# Patient Record
Sex: Female | Born: 1965
Health system: Southern US, Community
[De-identification: ages and names within clinical notes are randomized; demographics above are authoritative.]

## PROBLEM LIST (undated history)

## (undated) HISTORY — PX: EYE SURGERY: SHX253

---

## 2007-10-26 ENCOUNTER — Encounter (INDEPENDENT_AMBULATORY_CARE_PROVIDER_SITE_OTHER): Payer: Self-pay | Admitting: Nurse Practitioner

## 2007-10-26 ENCOUNTER — Ambulatory Visit: Payer: Self-pay | Admitting: Internal Medicine

## 2007-10-26 LAB — CONVERTED CEMR LAB
ALT: 13 units/L (ref 0–35)
Albumin: 4.4 g/dL (ref 3.5–5.2)
Basophils Relative: 0 % (ref 0–1)
Calcium: 9.3 mg/dL (ref 8.4–10.5)
Chloride: 105 meq/L (ref 96–112)
Creatinine, Ser: 0.75 mg/dL (ref 0.40–1.20)
Eosinophils Absolute: 0.1 10*3/uL (ref 0.0–0.7)
Eosinophils Relative: 2 % (ref 0–5)
Glucose, Bld: 110 mg/dL — ABNORMAL HIGH (ref 70–99)
Lymphocytes Relative: 44 % (ref 12–46)
MCHC: 33.1 g/dL (ref 30.0–36.0)
MCV: 87.2 fL (ref 78.0–100.0)
Monocytes Relative: 10 % (ref 3–12)
Platelets: 280 10*3/uL (ref 150–400)
Potassium: 4.4 meq/L (ref 3.5–5.3)
RDW: 13.1 % (ref 11.5–15.5)
Sodium: 140 meq/L (ref 135–145)
TSH: 1.346 microintl units/mL (ref 0.350–5.50)
Total Protein: 7.7 g/dL (ref 6.0–8.3)
WBC: 3.6 10*3/uL — ABNORMAL LOW (ref 4.0–10.5)

## 2007-10-27 ENCOUNTER — Ambulatory Visit: Payer: Self-pay | Admitting: *Deleted

## 2007-11-03 ENCOUNTER — Encounter (INDEPENDENT_AMBULATORY_CARE_PROVIDER_SITE_OTHER): Payer: Self-pay | Admitting: Family Medicine

## 2007-11-03 ENCOUNTER — Ambulatory Visit (HOSPITAL_COMMUNITY): Admission: RE | Admit: 2007-11-03 | Discharge: 2007-11-03 | Payer: Self-pay | Admitting: Family Medicine

## 2007-11-03 ENCOUNTER — Ambulatory Visit: Payer: Self-pay | Admitting: Surgery

## 2008-05-24 ENCOUNTER — Emergency Department (HOSPITAL_COMMUNITY): Admission: EM | Admit: 2008-05-24 | Discharge: 2008-05-24 | Payer: Self-pay | Admitting: Emergency Medicine

## 2009-03-27 ENCOUNTER — Ambulatory Visit: Payer: Self-pay | Admitting: Internal Medicine

## 2009-03-27 ENCOUNTER — Encounter (INDEPENDENT_AMBULATORY_CARE_PROVIDER_SITE_OTHER): Payer: Self-pay | Admitting: Internal Medicine

## 2009-03-27 LAB — CONVERTED CEMR LAB
ALT: 11 units/L (ref 0–35)
AST: 14 units/L (ref 0–37)
Albumin: 4.4 g/dL (ref 3.5–5.2)
Alkaline Phosphatase: 60 units/L (ref 39–117)
Calcium: 9.5 mg/dL (ref 8.4–10.5)
Cholesterol: 178 mg/dL (ref 0–200)
Creatinine, Ser: 0.48 mg/dL (ref 0.40–1.20)
HDL: 45 mg/dL (ref >39)
Sodium: 141 meq/L (ref 135–145)
Total Protein: 7.4 g/dL (ref 6.0–8.3)
VLDL: 22 mg/dL (ref 0–40)

## 2009-04-24 ENCOUNTER — Ambulatory Visit: Payer: Self-pay | Admitting: Internal Medicine

## 2009-04-24 ENCOUNTER — Encounter (INDEPENDENT_AMBULATORY_CARE_PROVIDER_SITE_OTHER): Payer: Self-pay | Admitting: Internal Medicine

## 2009-06-23 ENCOUNTER — Ambulatory Visit: Payer: Self-pay | Admitting: Internal Medicine

## 2009-06-23 ENCOUNTER — Encounter (INDEPENDENT_AMBULATORY_CARE_PROVIDER_SITE_OTHER): Payer: Self-pay | Admitting: Adult Health

## 2009-06-23 LAB — CONVERTED CEMR LAB
Chlamydia, DNA Probe: NEGATIVE
GC Probe Amp, Genital: NEGATIVE
Microalb, Ur: 0.5 mg/dL (ref 0.00–1.89)

## 2009-07-04 ENCOUNTER — Ambulatory Visit (HOSPITAL_COMMUNITY): Admission: RE | Admit: 2009-07-04 | Discharge: 2009-07-04 | Payer: Self-pay | Admitting: Family Medicine

## 2009-09-07 ENCOUNTER — Encounter (INDEPENDENT_AMBULATORY_CARE_PROVIDER_SITE_OTHER): Payer: Self-pay | Admitting: Adult Health

## 2009-09-07 ENCOUNTER — Ambulatory Visit: Payer: Self-pay | Admitting: Internal Medicine

## 2009-09-07 LAB — CONVERTED CEMR LAB
ALT: 11 units/L (ref 0–35)
Albumin: 4.4 g/dL (ref 3.5–5.2)
Calcium: 9.5 mg/dL (ref 8.4–10.5)
LDL Cholesterol: 109 mg/dL — ABNORMAL HIGH (ref 0–99)
Potassium: 4.4 meq/L (ref 3.5–5.3)
Sodium: 136 meq/L (ref 135–145)
Total Bilirubin: 0.5 mg/dL (ref 0.3–1.2)
Triglycerides: 92 mg/dL (ref <150)

## 2009-09-15 ENCOUNTER — Ambulatory Visit: Payer: Self-pay | Admitting: Internal Medicine

## 2011-12-11 ENCOUNTER — Other Ambulatory Visit (HOSPITAL_COMMUNITY): Payer: Self-pay | Admitting: Family Medicine

## 2011-12-11 DIAGNOSIS — Z1231 Encounter for screening mammogram for malignant neoplasm of breast: Secondary | ICD-10-CM

## 2012-01-03 ENCOUNTER — Ambulatory Visit (HOSPITAL_COMMUNITY)
Admission: RE | Admit: 2012-01-03 | Discharge: 2012-01-03 | Disposition: A | Discharge status: Home / Self Care | Payer: 59 | Source: Ambulatory Visit | Attending: Family Medicine | Admitting: Family Medicine

## 2012-01-03 DIAGNOSIS — Z1231 Encounter for screening mammogram for malignant neoplasm of breast: Secondary | ICD-10-CM

## 2012-06-29 ENCOUNTER — Other Ambulatory Visit: Payer: Self-pay | Admitting: Family Medicine

## 2012-06-29 DIAGNOSIS — M799 Soft tissue disorder, unspecified: Secondary | ICD-10-CM

## 2012-07-03 ENCOUNTER — Ambulatory Visit
Admission: RE | Admit: 2012-07-03 | Discharge: 2012-07-03 | Disposition: A | Discharge status: Home / Self Care | Payer: 59 | Source: Ambulatory Visit | Attending: Family Medicine | Admitting: Family Medicine

## 2012-07-03 DIAGNOSIS — M799 Soft tissue disorder, unspecified: Secondary | ICD-10-CM

## 2012-07-03 IMAGING — US US CHEST/MEDIASTINUM
1 series · 14 of 16 positions shown · non-contrast
Comparison: None.

CLINICAL DATA: The soft tissue nodules over the upper chest.

CHEST ULTRASOUND

[Series 1: us chest/mediastinum · 0.03mm/px · 14 of 19 slices shown]
[im 1/19]
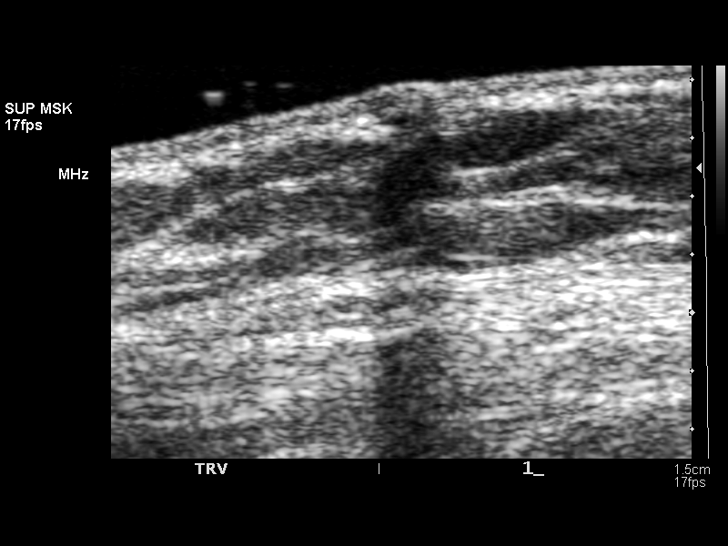
[im 2/19]
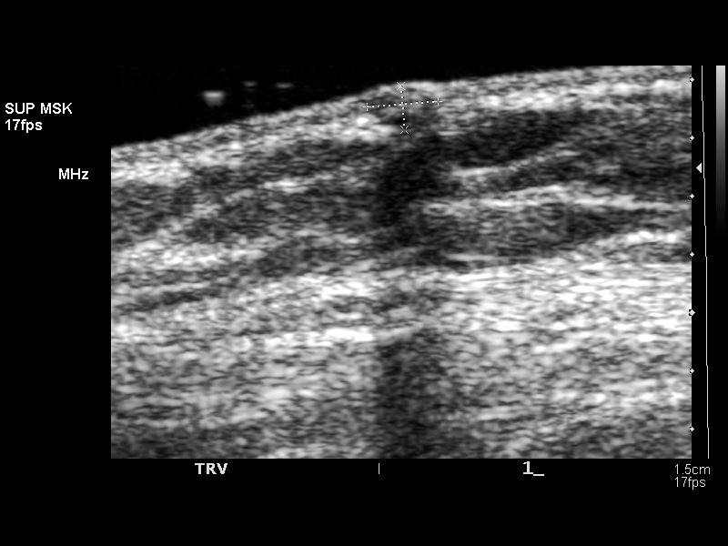
[im 3/19]
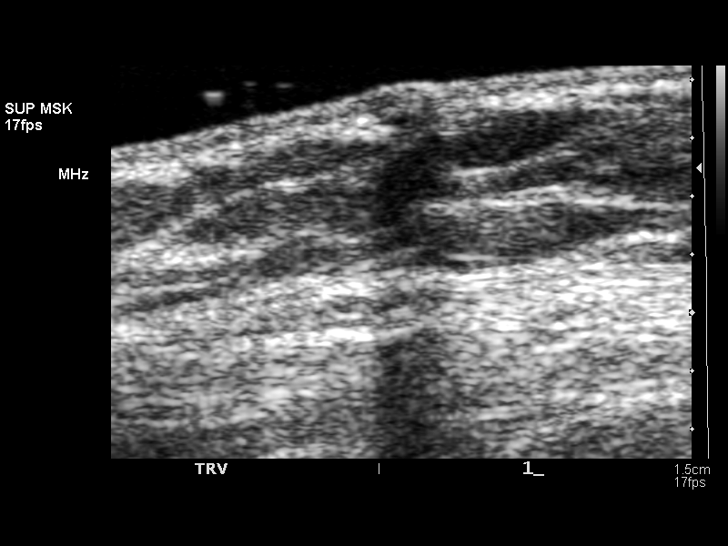
[im 5/19]
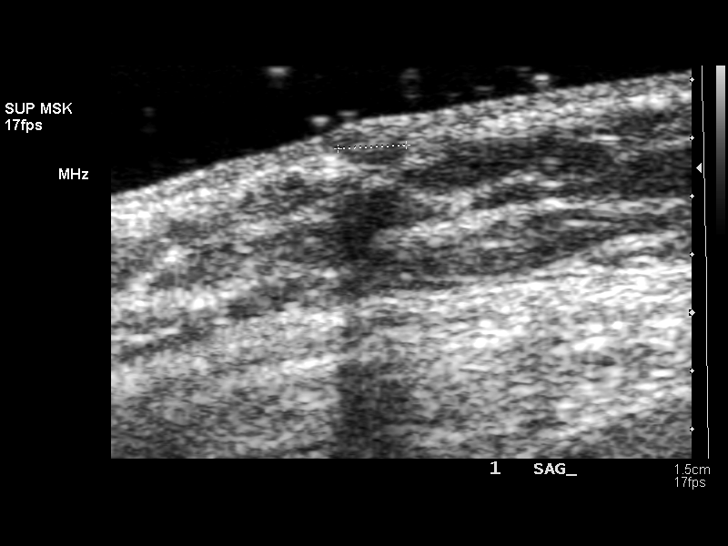
[im 7/19]
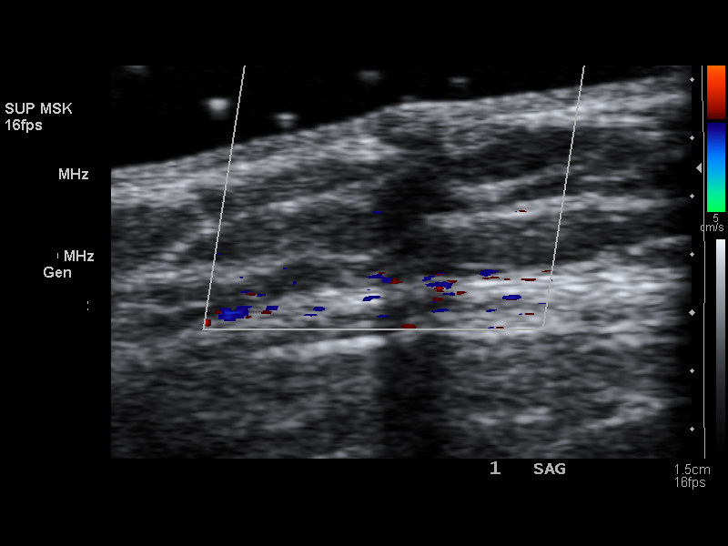
[im 8/19]
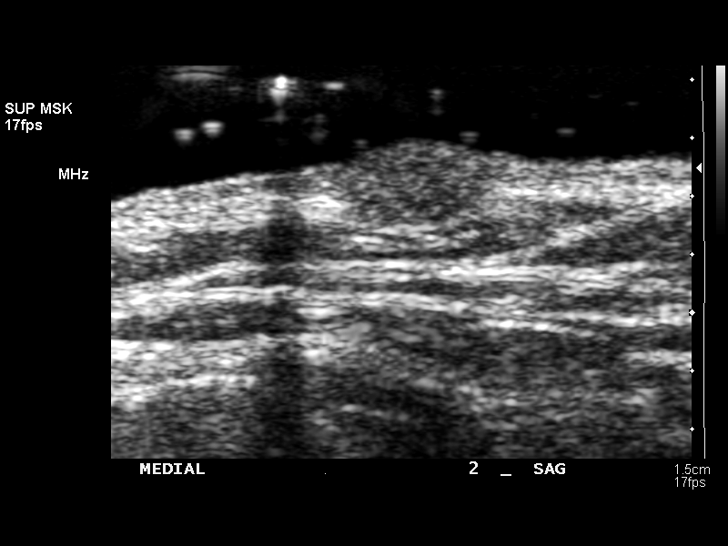
[im 9/19]
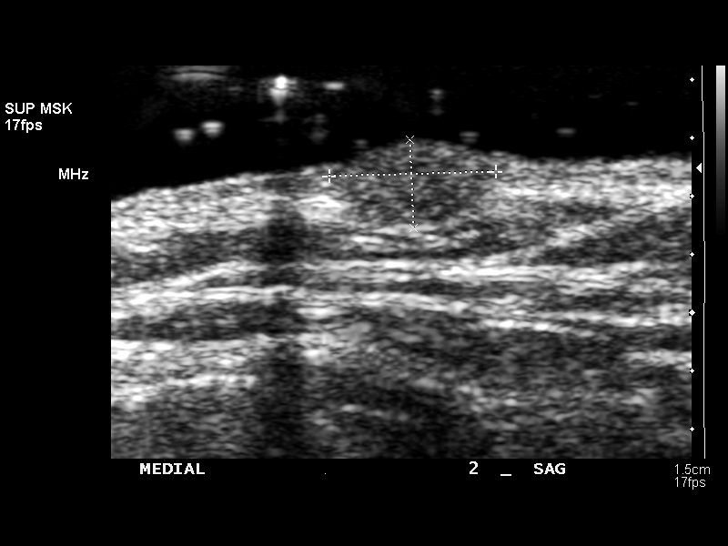
[im 10/19]
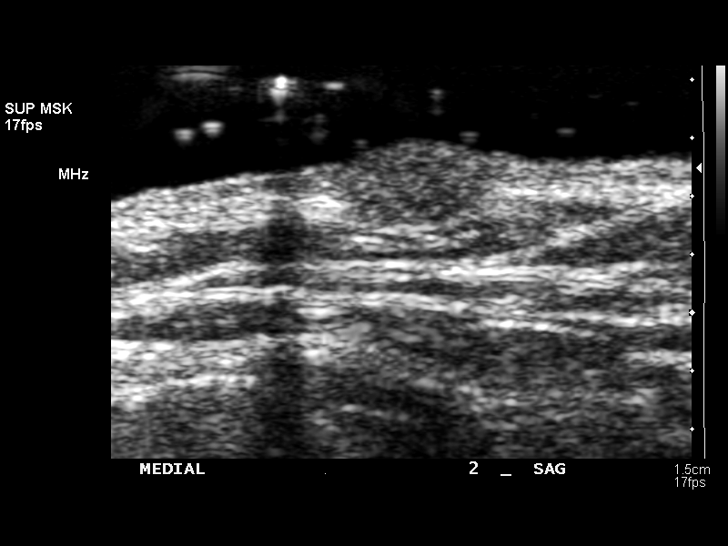
[im 11/19]
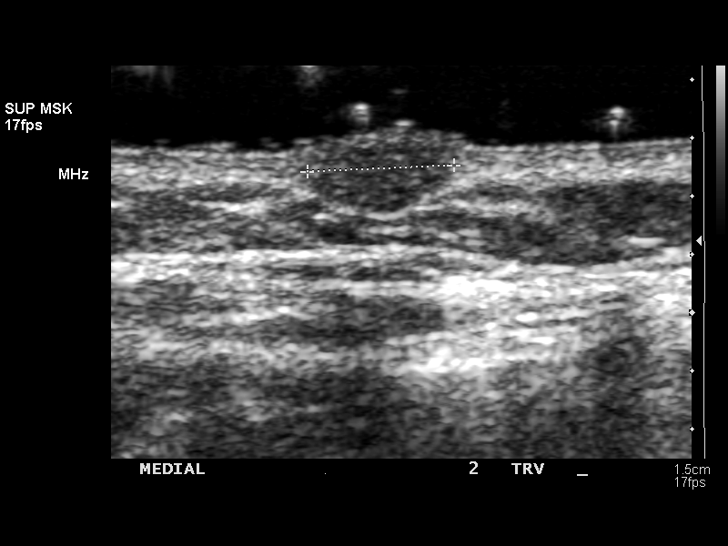
[im 13/19]
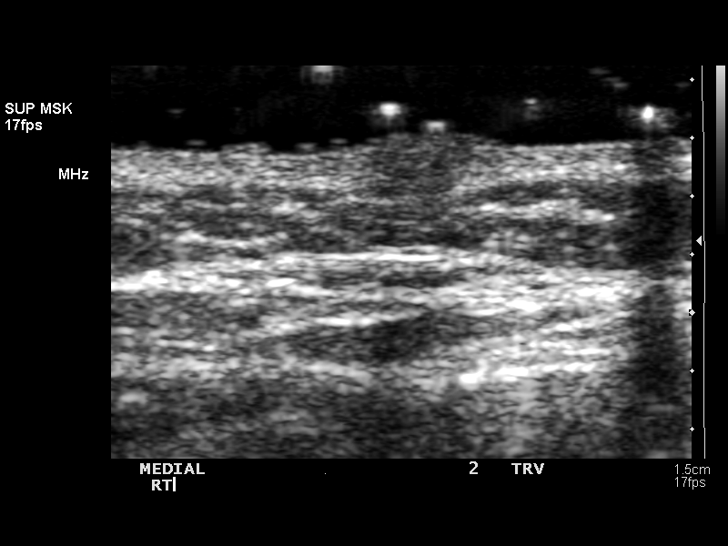
[im 15/19]
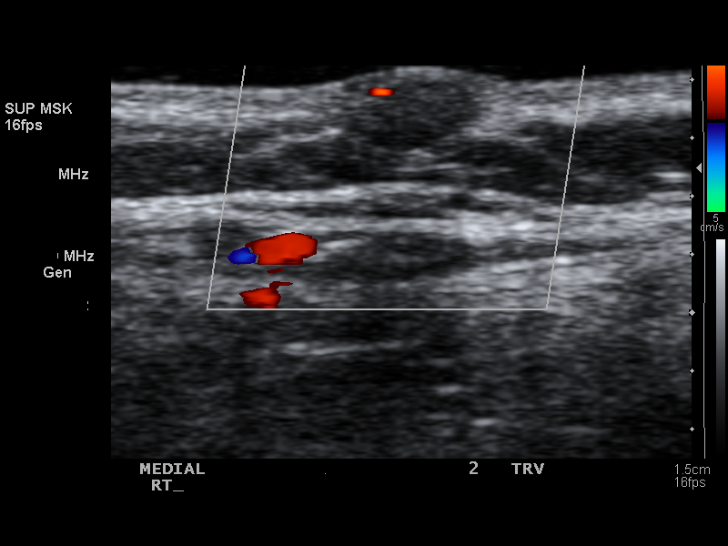
[im 16/19]
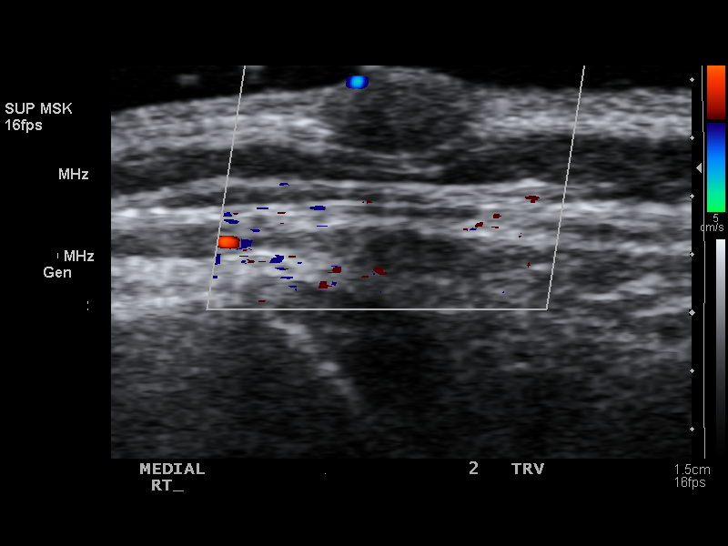
[im 17/19]
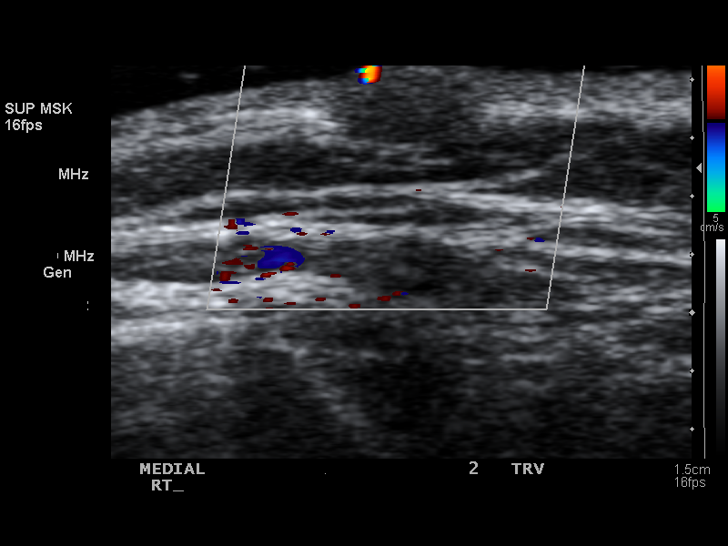
[im 19/19]
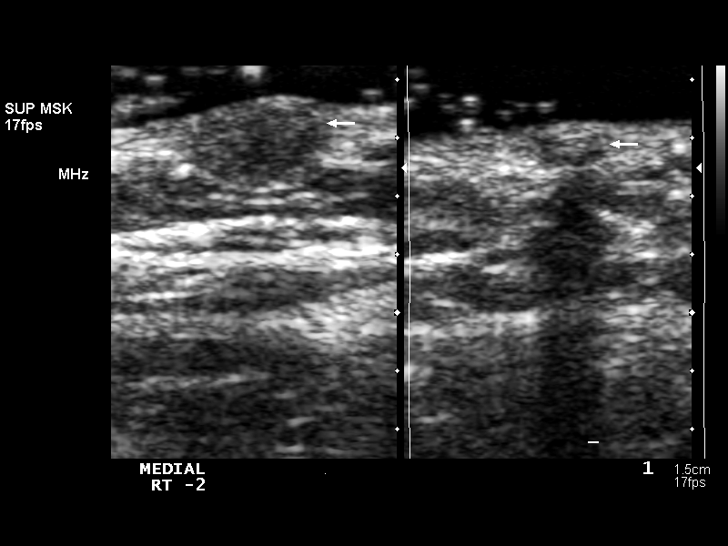

[14 of 16 positions shown; findings below may reference images not displayed]

FINDINGS: Ultrasound over the area in question was performed.
There are two immediately subcutaneous soft tissue nodules present.
One lies over the sternal notch measuring 0.2 x 0.2 x 0.2 cm.  No
definite blood flow is seen.  The second nodule lies over the right
clavicle medially measuring 0.6 x 0.3 x 0.5 cm with some blood flow
peripherally.  These most likely represent subcutaneous sebaceous
cysts.
IMPRESSION: Two small soft tissue nodules immediately subcutaneous in location
as noted above most consistent with sebaceous cysts.  Recommend
clinical follow-up.

## 2013-01-19 ENCOUNTER — Other Ambulatory Visit (HOSPITAL_COMMUNITY): Payer: Self-pay | Admitting: Family Medicine

## 2013-01-19 DIAGNOSIS — Z1231 Encounter for screening mammogram for malignant neoplasm of breast: Secondary | ICD-10-CM

## 2013-01-22 ENCOUNTER — Ambulatory Visit (HOSPITAL_COMMUNITY)
Admission: RE | Admit: 2013-01-22 | Discharge: 2013-01-22 | Disposition: A | Discharge status: Home / Self Care | Payer: 59 | Source: Ambulatory Visit | Attending: Family Medicine | Admitting: Family Medicine

## 2013-01-22 DIAGNOSIS — Z1231 Encounter for screening mammogram for malignant neoplasm of breast: Secondary | ICD-10-CM | POA: Insufficient documentation

## 2013-12-27 ENCOUNTER — Other Ambulatory Visit (HOSPITAL_COMMUNITY): Payer: Self-pay | Admitting: Family Medicine

## 2013-12-27 DIAGNOSIS — Z1231 Encounter for screening mammogram for malignant neoplasm of breast: Secondary | ICD-10-CM

## 2014-01-26 ENCOUNTER — Ambulatory Visit (HOSPITAL_COMMUNITY)
Admission: RE | Admit: 2014-01-26 | Discharge: 2014-01-26 | Disposition: A | Discharge status: Home / Self Care | Payer: 59 | Source: Ambulatory Visit | Attending: Family Medicine | Admitting: Family Medicine

## 2014-01-26 DIAGNOSIS — Z1231 Encounter for screening mammogram for malignant neoplasm of breast: Secondary | ICD-10-CM | POA: Diagnosis present

## 2015-08-23 ENCOUNTER — Ambulatory Visit (INDEPENDENT_AMBULATORY_CARE_PROVIDER_SITE_OTHER): Payer: 59 | Admitting: Physician Assistant

## 2015-08-23 VITALS — BP 120/70 | HR 90 | Temp 98.9°F | Resp 16 | Ht 60.0 in | Wt 192.0 lb

## 2015-08-23 DIAGNOSIS — R05 Cough: Secondary | ICD-10-CM

## 2015-08-23 DIAGNOSIS — R0981 Nasal congestion: Secondary | ICD-10-CM | POA: Diagnosis not present

## 2015-08-23 DIAGNOSIS — R059 Cough, unspecified: Secondary | ICD-10-CM

## 2015-08-23 MED ORDER — BENZONATATE 100 MG PO CAPS: 100.0000 mg | ORAL_CAPSULE | Freq: Three times a day (TID) | ORAL | Status: AC | PRN

## 2015-08-23 MED ORDER — IPRATROPIUM BROMIDE 0.03 % NA SOLN: 2.0000 | Freq: Two times a day (BID) | NASAL | Status: AC

## 2015-08-23 NOTE — Progress Notes (Signed)
   Subjective:    Patient ID: Anne Fisher, female    DOB: 07/20/1965, 50 y.o.   MRN: [id number]  Chief Complaint  Patient presents with  . Cough  . Nasal Congestion    HPI  [name] is a 50 yo female with no significant pmh here today for x 1 week history of cough, and nasal congestion.  She states that her symptoms started approx 1 week ago. She endorses productive cough with white mucus, clear rhinorrhea, myalgia, sore throat, sinus pressure. She also complains of intermittent diarrhea. Denies fever, chills, headache, ear pain, N/V, abd pain.  Her husband was treated today for the flu and given Tamiflu. His symptoms started 2 days ago.   Review of Systems  Constitutional: Positive for chills and fatigue. Negative for fever.  HENT: Positive for congestion, rhinorrhea, sinus pressure and sore throat. Negative for ear pain.   Eyes: Negative.   Respiratory: Positive for cough. Negative for shortness of breath.   Cardiovascular: Negative for chest pain.  Gastrointestinal: Positive for diarrhea. Negative for nausea, vomiting and abdominal pain.  Genitourinary: Negative for dysuria, urgency, frequency and flank pain.  Musculoskeletal: Positive for myalgias.  Neurological: Negative for dizziness, syncope, light-headedness and headaches.       Objective:   Physical Exam  Constitutional: She is oriented to person, place, and time. She appears well-developed and well-nourished. No distress.  HENT:  Head: Normocephalic and atraumatic.  Right Ear: External ear normal.  Left Ear: External ear normal.  Nose: Nose normal.  Mouth/Throat: Oropharynx is clear and moist. No oropharyngeal exudate.  Eyes: Conjunctivae are normal. Pupils are equal, round, and reactive to light.  Neck: Normal range of motion. Neck supple. No thyromegaly present.  Cardiovascular: Normal rate, regular rhythm and normal heart sounds.   Pulmonary/Chest: Effort normal and breath sounds normal.  Abdominal: Soft.  Bowel sounds are normal. There is no tenderness.  Lymphadenopathy:    She has no cervical adenopathy.  Neurological: She is alert and oriented to person, place, and time.  Skin: Skin is warm and dry.  Psychiatric: She has a normal mood and affect. Her behavior is normal. Judgment and thought content normal.          Assessment & Plan:  1. Cough 2. Sinus congestion Symptoms have been present for 1 week. Most likely viral URI. Influenza swab would not change treatment. Tamiflu is not recommended because symptoms have been present longer than 48 hours. Educated patient on getting plenty of rest and drinking fluids. - benzonatate (TESSALON) 100 MG capsule; Take 1-2 capsules (100-200 mg total) by mouth 3 (three) times daily as needed for cough.  Dispense: 40 capsule; Refill: 0 - ipratropium (ATROVENT) 0.03 % nasal spray; Place 2 sprays into both nostrils 2 (two) times daily.  Dispense: 30 mL; Refill: 0

## 2015-08-23 NOTE — Patient Instructions (Addendum)
Please get plenty of rest and stay hydrated. May take ibuprofen and tylenol for fever and pain.    IF you received an x-ray today, you will [facility]. Please contact [facility] at [phone] with questions or concerns regarding your invoice.   IF you received labwork today, you will receive an invoice from [facility]. Please contact [facility] at [phone] with questions or concerns regarding your invoice.   Our billing staff will not be able to assist you with questions regarding bills from these companies.  You will be contacted with the lab results as soon as they are available. The fastest way to get your results is to activate your My Chart account. Instructions are located on the last page of this paperwork. If you have not heard from us regarding the results in 2 weeks, please contact this office.

## 2015-08-26 NOTE — Progress Notes (Signed)
Patient ID: Anne Fisher, female    DOB: 1966-01-31, 50 y.o.   MRN: [id number]  PCP: No primary care provider on file.  Chief Complaint  Patient presents with  . Cough  . Nasal Congestion    Subjective:  HPI Presents today for evaluation of 1 week history of cough, and nasal congestion. She is accompanied by her husband and her son.  She states that her symptoms started approx 1 week ago. She endorses productive cough with white mucus, clear rhinorrhea, myalgia, sore throat, sinus pressure. She also complains of intermittent diarrhea. Denies fever, chills, headache, ear pain, N/V, abd pain.   Her husband was also seen today and  treated empirically for the flu and with Tamiflu. His symptoms started 2 days ago.      Review of Systems Constitutional: Positive for chills and fatigue. Negative for fever.  HENT: Positive for congestion, rhinorrhea, sinus pressure and sore throat. Negative for ear pain.  Eyes: Negative.  Respiratory: Positive for cough. Negative for shortness of breath.  Cardiovascular: Negative for chest pain.  Gastrointestinal: Positive for diarrhea. Negative for nausea, vomiting and abdominal pain.  Genitourinary: Negative for dysuria, urgency, frequency and flank pain.  Musculoskeletal: Positive for myalgias.  Neurological: Negative for dizziness, syncope, light-headedness and headaches.     There are no active problems to display for this patient.   No Known Allergies  Prior to Admission medications   NONE.   Past Medical, Surgical Family and Social History reviewed and updated.        Objective:  Physical Exam  Constitutional: She is oriented to person, place, and time. She appears well-developed and well-nourished. She is active and cooperative. No distress.  BP 120/70 mmHg  Pulse 90  Temp(Src) 98.9 F (37.2 C) (Oral)  Resp 16  Ht 5' (1.524 m)  Wt 192 lb (87.091 kg)  BMI 37.50 kg/m2  SpO2 98%  HENT:  Head: Normocephalic and  atraumatic.  Right Ear: Hearing, tympanic membrane, external ear and ear canal normal.  Left Ear: Hearing, tympanic membrane, external ear and ear canal normal.  Nose: Nose normal. Right sinus exhibits no maxillary sinus tenderness and no frontal sinus tenderness. Left sinus exhibits no maxillary sinus tenderness and no frontal sinus tenderness.  Mouth/Throat: Uvula is midline, oropharynx is clear and moist and mucous membranes are normal. No oral lesions. No uvula swelling.  Eyes: Conjunctivae are normal. No scleral icterus.  Neck: Normal range of motion. Neck supple. No thyromegaly present.  Cardiovascular: Normal rate, regular rhythm and normal heart sounds.   Pulses:      Radial pulses are 2+ on the right side, and 2+ on the left side.  Pulmonary/Chest: Effort normal and breath sounds normal.  Lymphadenopathy:       Head (right side): No tonsillar, no preauricular, no posterior auricular and no occipital adenopathy present.       Head (left side): No tonsillar, no preauricular, no posterior auricular and no occipital adenopathy present.    She has no cervical adenopathy.       Right: No supraclavicular adenopathy present.       Left: No supraclavicular adenopathy present.  Neurological: She is alert and oriented to person, place, and time. No sensory deficit.  Skin: Skin is warm, dry and intact. No rash noted. No cyanosis or erythema. Nails show no clubbing.  Psychiatric: She has a normal mood and affect. Her speech is normal and behavior is normal.  Assessment & Plan:  1. Cough Likely due to PND. Supportive care. - benzonatate (TESSALON) 100 MG capsule; Take 1-2 capsules (100-200 mg total) by mouth 3 (three) times daily as needed for cough.  Dispense: 40 capsule; Refill: 0  2. Sinus congestion Likely viral URI. Supportive care. - ipratropium (ATROVENT) 0.03 % nasal spray; Place 2 sprays into both nostrils 2 (two) times daily.  Dispense: 30 mL; Refill: 0   Fernande Brashelle S.  Dwight Burdo, PA-C Physician Assistant-Certified [facility]

## 2016-10-03 DIAGNOSIS — E7800 Pure hypercholesterolemia, unspecified: Secondary | ICD-10-CM | POA: Diagnosis not present

## 2016-10-03 DIAGNOSIS — E119 Type 2 diabetes mellitus without complications: Secondary | ICD-10-CM | POA: Diagnosis not present

## 2017-02-26 DIAGNOSIS — Z1231 Encounter for screening mammogram for malignant neoplasm of breast: Secondary | ICD-10-CM | POA: Diagnosis not present

## 2017-06-23 DIAGNOSIS — I10 Essential (primary) hypertension: Secondary | ICD-10-CM | POA: Diagnosis not present

## 2017-06-23 DIAGNOSIS — E7800 Pure hypercholesterolemia, unspecified: Secondary | ICD-10-CM | POA: Diagnosis not present

## 2017-06-23 DIAGNOSIS — E119 Type 2 diabetes mellitus without complications: Secondary | ICD-10-CM | POA: Diagnosis not present

## 2017-09-22 DIAGNOSIS — E119 Type 2 diabetes mellitus without complications: Secondary | ICD-10-CM | POA: Diagnosis not present

## 2017-09-22 DIAGNOSIS — E7800 Pure hypercholesterolemia, unspecified: Secondary | ICD-10-CM | POA: Diagnosis not present

## 2017-09-22 DIAGNOSIS — I10 Essential (primary) hypertension: Secondary | ICD-10-CM | POA: Diagnosis not present

## 2017-10-20 ENCOUNTER — Encounter: Payer: Self-pay | Admitting: Registered"

## 2017-10-20 ENCOUNTER — Encounter: Payer: 59 | Attending: Family Medicine | Admitting: Registered"

## 2017-10-20 DIAGNOSIS — Z713 Dietary counseling and surveillance: Secondary | ICD-10-CM | POA: Insufficient documentation

## 2017-10-20 DIAGNOSIS — E119 Type 2 diabetes mellitus without complications: Secondary | ICD-10-CM | POA: Diagnosis not present

## 2017-10-20 NOTE — Progress Notes (Signed)
Diabetes Self-Management Education  Visit Type: First/Initial  Appt. Start Time: 1615 Appt. End Time: 1710  10/20/2017  [name], identified by name and date of birth, is a 52 y.o. female with a diagnosis of Diabetes: Type 2.   ASSESSMENT Patient is here with her son who provided translation. Pt speaks some English. Patient states since her A1c in Feb 6.2% she switched from using sugar in her coffee and tea to using splenda and cut back on her rice and pasta consumption. A1c in May was 5.9%. Patient reports that she still enjoys sweets and soda occasionally.   Patient states that when she checks her blood pressure at home it is in the normal range. Pt states she was recently started on lisinopril, but her blood pressure is only high when she is at the doctor's office.  Diabetes Self-Management Education - 10/20/17 1625      Visit Information   Visit Type  First/Initial      Initial Visit   Diabetes Type  Type 2    Are you currently following a meal plan?  No    Are you taking your medications as prescribed?  Yes metformin 500 mg    Date Diagnosed  2 yrs ago      Health Coping   How would you rate your overall health?  Good      Psychosocial Assessment   Patient Belief/Attitude about Diabetes  Motivated to manage diabetes    What is the last grade level you completed in school?  some college      Complications   Last HgB A1C per patient/outside source  5.9 % per referral labs    How often do you check your blood sugar?  3-4 times / week checking ~1x week    Fasting Blood glucose range (mg/dL)  [phone]    Number of hypoglycemic episodes per month  0    Number of hyperglycemic episodes per week  0    Have you had a dilated eye exam in the past 12 months?  Yes    Have you had a dental exam in the past 12 months?  Yes    Are you checking your feet?  No      Dietary Intake   Breakfast  hot tea, fruit OR oatmeal OR biscuit, coffee w cream, splenda    Snack (morning)   none    Lunch  chicken, 2 c rice, salad     Snack (afternoon)  tea with milk, crackers    Dinner  beans, meat, rice    Snack (evening)  none, tea    Beverage(s)  tea splenda, water, coffee, 14 oz OJ juice      Exercise   Exercise Type  Light (walking / raking leaves)    How many days per week to you exercise?  3    How many minutes per day do you exercise?  45    Total minutes per week of exercise  135      Patient Education   Previous Diabetes Education  No    Nutrition management   Role of diet in the treatment of diabetes and the relationship between the three main macronutrients and blood glucose level;Carbohydrate counting    Physical activity and exercise   Role of exercise on diabetes management, blood pressure control and cardiac health.    Monitoring  Identified appropriate SMBG and/or A1C goals.      Individualized Goals (developed by patient)   Nutrition  General guidelines for healthy choices and portions discussed    Physical Activity  Exercise 3-5 times per week      Outcomes   Expected Outcomes  Demonstrated interest in learning. Expect positive outcomes    Future DMSE  PRN    Program Status  Not Completed     Individualized Plan for Diabetes Self-Management Training:   Learning Objective:  Patient will have a greater understanding of diabetes self-management. Patient education plan is to attend individual and/or group sessions per assessed needs and concerns.   Patient Instructions  Consider increasing your days of activity from 2 to 3 days. Consider having smaller portions of rice If having something sweet, cut back on portions of other carbohydrates in the meal. Consider having smaller portions of juice or other sweetened beverages. May want to try sparkling water with a bit of frozen berries added for flavor.   Expected Outcomes:  Demonstrated interest in learning. Expect positive outcomes  Education material provided: My Plate, Arabic page of GDM  carbohydrates,  Diabetes basics in Arabic/English  If problems or questions, patient to contact team via:  Phone  Future DSME appointment: PRN

## 2017-10-20 NOTE — Patient Instructions (Signed)
Consider increasing your days of activity from 2 to 3 days. Consider having smaller portions of rice If having something sweet, cut back on portions of other carbohydrates in the meal. Consider having smaller portions of juice or other sweetened beverages. May want to try sparkling water with a bit of frozen berries added for flavor.

## 2018-03-30 DIAGNOSIS — E7800 Pure hypercholesterolemia, unspecified: Secondary | ICD-10-CM | POA: Diagnosis not present

## 2018-03-30 DIAGNOSIS — E119 Type 2 diabetes mellitus without complications: Secondary | ICD-10-CM | POA: Diagnosis not present

## 2018-03-30 DIAGNOSIS — I10 Essential (primary) hypertension: Secondary | ICD-10-CM | POA: Diagnosis not present

## 2020-03-01 ENCOUNTER — Other Ambulatory Visit: Payer: Self-pay

## 2020-03-01 ENCOUNTER — Encounter: Payer: Self-pay | Admitting: Registered Nurse

## 2020-03-01 ENCOUNTER — Ambulatory Visit (INDEPENDENT_AMBULATORY_CARE_PROVIDER_SITE_OTHER): Payer: 59 | Admitting: Registered Nurse

## 2020-03-01 VITALS — BP 125/85 | HR 96 | Temp 98.1°F | Resp 17 | Ht 60.0 in | Wt 183.2 lb

## 2020-03-01 DIAGNOSIS — E119 Type 2 diabetes mellitus without complications: Secondary | ICD-10-CM

## 2020-03-01 DIAGNOSIS — M79631 Pain in right forearm: Secondary | ICD-10-CM

## 2020-03-01 MED ORDER — DICLOFENAC SODIUM 75 MG PO TBEC: 75.0000 mg | DELAYED_RELEASE_TABLET | Freq: Two times a day (BID) | ORAL | 0 refills | Status: AC

## 2020-03-01 NOTE — Patient Instructions (Signed)
° ° ° °  If you have lab work done today you will be contacted with your lab results within the next 2 weeks.  If you have not heard from us then please contact us. The fastest way to get your results is to register for My Chart. ° ° °IF you received an x-ray today, you will receive an invoice from [facility]. Please contact [facility] at [phone] with questions or concerns regarding your invoice.  ° °IF you received labwork today, you will receive an invoice from [facility]. Please contact [facility] at [phone] with questions or concerns regarding your invoice.  ° °Our billing staff will not be able to assist you with questions regarding bills from these companies. ° °You will be contacted with the lab results as soon as they are available. The fastest way to get your results is to activate your My Chart account. Instructions are located on the last page of this paperwork. If you have not heard from us regarding the results in 2 weeks, please contact this office. °  ° ° ° °

## 2020-03-02 LAB — COMPREHENSIVE METABOLIC PANEL
ALT: 13 IU/L (ref 0–32)
AST: 17 IU/L (ref 0–40)
Albumin/Globulin Ratio: 1.5 (ref 1.2–2.2)
Albumin: 4.5 g/dL (ref 3.8–4.9)
Alkaline Phosphatase: 77 IU/L (ref 44–121)
BUN/Creatinine Ratio: 33 — ABNORMAL HIGH (ref 9–23)
BUN: 16 mg/dL (ref 6–24)
Bilirubin Total: 0.2 mg/dL (ref 0.0–1.2)
CO2: 22 mmol/L (ref 20–29)
Calcium: 9.6 mg/dL (ref 8.7–10.2)
Chloride: 105 mmol/L (ref 96–106)
Creatinine, Ser: 0.48 mg/dL — ABNORMAL LOW (ref 0.57–1.00)
GFR calc Af Amer: 129 mL/min/{1.73_m2} (ref >59)
GFR calc non Af Amer: 112 mL/min/{1.73_m2} (ref >59)
Globulin, Total: 3.1 g/dL (ref 1.5–4.5)
Glucose: 114 mg/dL — ABNORMAL HIGH (ref 65–99)
Potassium: 4.1 mmol/L (ref 3.5–5.2)
Sodium: 142 mmol/L (ref 134–144)
Total Protein: 7.6 g/dL (ref 6.0–8.5)

## 2020-03-02 LAB — CBC
Hematocrit: 36.4 % (ref 34.0–46.6)
Hemoglobin: 11.8 g/dL (ref 11.1–15.9)
MCH: 28.9 pg (ref 26.6–33.0)
MCHC: 32.4 g/dL (ref 31.5–35.7)
MCV: 89 fL (ref 79–97)
Platelets: 309 10*3/uL (ref 150–450)
RBC: 4.09 x10E6/uL (ref 3.77–5.28)
RDW: 12.5 % (ref 11.7–15.4)
WBC: 4.7 10*3/uL (ref 3.4–10.8)

## 2020-03-02 LAB — HEMOGLOBIN A1C
Est. average glucose Bld gHb Est-mCnc: 146 mg/dL
Hgb A1c MFr Bld: 6.7 % — ABNORMAL HIGH (ref 4.8–5.6)

## 2020-03-02 LAB — LIPID PANEL
Chol/HDL Ratio: 2.6 ratio (ref 0.0–4.4)
Cholesterol, Total: 133 mg/dL (ref 100–199)
HDL: 51 mg/dL (ref >39)
LDL Chol Calc (NIH): 67 mg/dL (ref 0–99)
Triglycerides: 75 mg/dL (ref 0–149)
VLDL Cholesterol Cal: 15 mg/dL (ref 5–40)

## 2020-03-13 ENCOUNTER — Other Ambulatory Visit: Payer: Self-pay | Admitting: Registered Nurse

## 2020-03-13 DIAGNOSIS — M79631 Pain in right forearm: Secondary | ICD-10-CM

## 2020-03-13 NOTE — Telephone Encounter (Signed)
Requested medication (s) are due for refill today: yes  Requested medication (s) are on the active medication list: yes  Last refill:  03/01/20 #30  Future visit scheduled: no  Notes to clinic:  please review   Requested Prescriptions  Pending Prescriptions Disp Refills   diclofenac (VOLTAREN) 75 MG EC tablet [Pharmacy Med Name: DICLOFENAC SODIUM 75MG  DR TABLETS] 30 tablet 0    Sig: TAKE 1 TABLET(75 MG) BY MOUTH TWICE DAILY      Analgesics:  NSAIDS Failed - 03/13/2020  3:44 AM      Failed - Cr in normal range and within 360 days    Creatinine, Ser  Date Value Ref Range Status  03/01/2020 0.48 (L) 0.57 - 1.00 mg/dL Final          Passed - HGB in normal range and within 360 days    Hemoglobin  Date Value Ref Range Status  03/01/2020 11.8 11.1 - 15.9 g/dL Final          Passed - Patient is not pregnant      Passed - Valid encounter within last 12 months    Recent Outpatient Visits           1 week ago Controlled type 2 diabetes mellitus without complication, without long-term current use of insulin (HCC)   Primary Care at 03/03/2020, Shelbie Ammons, NP   4 years ago Cough   Primary Care at [facility], [place], [place]

## 2020-03-22 ENCOUNTER — Other Ambulatory Visit: Payer: Self-pay | Admitting: Registered Nurse

## 2020-03-22 DIAGNOSIS — E119 Type 2 diabetes mellitus without complications: Secondary | ICD-10-CM

## 2020-03-22 MED ORDER — METFORMIN HCL 500 MG PO TABS: 500.0000 mg | ORAL_TABLET | Freq: Two times a day (BID) | ORAL | 3 refills | Status: AC

## 2020-03-22 NOTE — Progress Notes (Signed)
Pt needs to restart metformin Sent 1 year supply Recheck in 6 months  Jari Sportsman, NP

## 2020-05-18 ENCOUNTER — Encounter: Payer: Self-pay | Admitting: Registered Nurse

## 2020-05-18 NOTE — Progress Notes (Signed)
Established Patient Office Visit  Subjective:  Patient ID: Anne Fisher, female    DOB: 01-Dec-1965  Age: 55 y.o. MRN: [id number]  CC:  Chief Complaint  Patient presents with  . Arm Pain    Patient states she has some pain in here right arm lifting something heavy. Per patient she has not taken anything but the pain is getting worse.    HPI Anne Fisher presents for t2dm follow up and arm pain  Last A1c:  Lab Results  Component Value Date   HGBA1C 6.7 (H) 03/01/2020    Currently taking: lifestyle control  Diet has been steady Exercise habits have been steady  Arm Pain Ongoing for a few weeks - months. Pain when lifting anything more than a few pounds Starts at elbow and radiates proximally.  No redness or swelling No acute injury noted Some pain with wrist flexion and extension against resistance   Otherwise feeling well  History reviewed. No pertinent past medical history.  Past Surgical History:  Procedure Laterality Date  . EYE SURGERY      Family History  Problem Relation Age of Onset  . Diabetes Mother   . Diabetes Father   . Diabetes Sister   . Diabetes Brother     Social History   Socioeconomic History  . Marital status: Married    Spouse name: Not on file  . Number of children: Not on file  . Years of education: Not on file  . Highest education level: Not on file  Occupational History  . Not on file  Tobacco Use  . Smoking status: Never Smoker  . Smokeless tobacco: Never Used  Substance and Sexual Activity  . Alcohol use: Not on file  . Drug use: Not on file  . Sexual activity: Not on file  Other Topics Concern  . Not on file  Social History Narrative  . Not on file   Social Determinants of Health   Financial Resource Strain: Not on file  Food Insecurity: Not on file  Transportation Needs: Not on file  Physical Activity: Not on file  Stress: Not on file  Social Connections: Not on file  Intimate Partner Violence: Not on file     Outpatient Medications Prior to Visit  Medication Sig Dispense Refill  . benzonatate (TESSALON) 100 MG capsule Take 1-2 capsules (100-200 mg total) by mouth 3 (three) times daily as needed for cough. 40 capsule 0  . ipratropium (ATROVENT) 0.03 % nasal spray Place 2 sprays into both nostrils 2 (two) times daily. 30 mL 0  . lisinopril (PRINIVIL,ZESTRIL) 20 MG tablet Take 20 mg by mouth daily.    . metFORMIN (GLUCOPHAGE) 500 MG tablet Take 500 mg by mouth 2 (two) times daily with a meal.     No facility-administered medications prior to visit.    No Known Allergies  ROS Review of Systems Per hpi     Objective:    Physical Exam Vitals and nursing note reviewed.  Constitutional:      General: She is not in acute distress.    Appearance: Normal appearance. She is normal weight. She is not ill-appearing, toxic-appearing or diaphoretic.  Cardiovascular:     Rate and Rhythm: Normal rate and regular rhythm.     Heart sounds: Normal heart sounds. No murmur heard. No friction rub. No gallop.   Pulmonary:     Effort: Pulmonary effort is normal. No respiratory distress.     Breath sounds: Normal breath sounds. No stridor. No wheezing,  rhonchi or rales.  Chest:     Chest wall: No tenderness.  Musculoskeletal:        General: Tenderness (mild ventral side of R forearm) present. No swelling, deformity or signs of injury. Normal range of motion.     Right lower leg: No edema.     Left lower leg: No edema.     Comments: Pain with wrist ROM against resistance  Skin:    General: Skin is warm and dry.  Neurological:     General: No focal deficit present.     Mental Status: She is alert and oriented to person, place, and time. Mental status is at baseline.  Psychiatric:        Mood and Affect: Mood normal.        Behavior: Behavior normal.        Thought Content: Thought content normal.        Judgment: Judgment normal.     BP 125/85   Pulse 96   Temp 98.1 F (36.7 C) (Temporal)    Resp 17   Ht 5' (1.524 m)   Wt 183 lb 3.2 oz (83.1 kg)   SpO2 99%   BMI 35.78 kg/m  Wt Readings from Last 3 Encounters:  03/01/20 183 lb 3.2 oz (83.1 kg)  08/23/15 192 lb (87.1 kg)     Health Maintenance Due  Topic Date Due  . Hepatitis C Screening  Never done  . PNEUMOCOCCAL POLYSACCHARIDE VACCINE AGE 71-64 HIGH RISK  Never done  . FOOT EXAM  Never done  . OPHTHALMOLOGY EXAM  Never done  . COVID-19 Vaccine (1) Never done  . TETANUS/TDAP  Never done  . COLONOSCOPY (Pts 45-64yrs Insurance coverage will need to be confirmed)  Never done  . PAP SMEAR-Modifier  06/23/2012  . MAMMOGRAM  01/27/2016    There are no preventive care reminders to display for this patient.  Lab Results  Component Value Date   TSH 1.346 10/26/2007   Lab Results  Component Value Date   WBC 4.7 03/01/2020   HGB 11.8 03/01/2020   HCT 36.4 03/01/2020   MCV 89 03/01/2020   PLT 309 03/01/2020   Lab Results  Component Value Date   NA 142 03/01/2020   K 4.1 03/01/2020   CO2 22 03/01/2020   GLUCOSE 114 (H) 03/01/2020   BUN 16 03/01/2020   CREATININE 0.48 (L) 03/01/2020   BILITOT 0.2 03/01/2020   ALKPHOS 77 03/01/2020   AST 17 03/01/2020   ALT 13 03/01/2020   PROT 7.6 03/01/2020   ALBUMIN 4.5 03/01/2020   CALCIUM 9.6 03/01/2020   Lab Results  Component Value Date   CHOL 133 03/01/2020   Lab Results  Component Value Date   HDL 51 03/01/2020   Lab Results  Component Value Date   LDLCALC 67 03/01/2020   Lab Results  Component Value Date   TRIG 75 03/01/2020   Lab Results  Component Value Date   CHOLHDL 2.6 03/01/2020   Lab Results  Component Value Date   HGBA1C 6.7 (H) 03/01/2020      Assessment & Plan:   Problem List Items Addressed This Visit      Endocrine   Controlled type 2 diabetes mellitus without complication, without long-term current use of insulin (HCC) - Primary   Relevant Orders   CBC (Completed)   Comprehensive metabolic panel (Completed)   Hemoglobin  A1c (Completed)   Lipid panel (Completed)    Other Visit Diagnoses    Right  forearm pain       Relevant Medications   diclofenac (VOLTAREN) 75 MG EC tablet      Meds ordered this encounter  Medications  . diclofenac (VOLTAREN) 75 MG EC tablet    Sig: Take 1 tablet (75 mg total) by mouth 2 (two) times daily.    Dispense:  30 tablet    Refill:  0    Order Specific Question:   Supervising Provider    Answer:   Neva Seat, JEFFREY R [[id number]]    Follow-up: No follow-ups on file.   PLAN  Overuse injury likely in forearm - unclear etiology - will try short course of voltaren prn use   t2dm - labs drawn - borderline a1c - improve diet and exercise, recheck in 6 mo  Patient encouraged to call clinic with any questions, comments, or concerns.  Janeece Agee, NP

## 2021-05-29 ENCOUNTER — Other Ambulatory Visit: Payer: Self-pay | Admitting: Registered Nurse

## 2021-05-29 DIAGNOSIS — E119 Type 2 diabetes mellitus without complications: Secondary | ICD-10-CM

## 2023-09-03 ENCOUNTER — Ambulatory Visit (INDEPENDENT_AMBULATORY_CARE_PROVIDER_SITE_OTHER)

## 2023-09-03 ENCOUNTER — Ambulatory Visit (INDEPENDENT_AMBULATORY_CARE_PROVIDER_SITE_OTHER): Admitting: Podiatry

## 2023-09-03 ENCOUNTER — Encounter: Payer: Self-pay | Admitting: Podiatry

## 2023-09-03 DIAGNOSIS — M21619 Bunion of unspecified foot: Secondary | ICD-10-CM

## 2023-09-03 DIAGNOSIS — M2041 Other hammer toe(s) (acquired), right foot: Secondary | ICD-10-CM

## 2023-09-03 DIAGNOSIS — M21611 Bunion of right foot: Secondary | ICD-10-CM | POA: Diagnosis not present

## 2023-09-03 NOTE — Progress Notes (Signed)
  Subjective:  Patient ID: Anne Fisher, female    DOB: 20-Jan-1966,   MRN: [id number]  No chief complaint on file.   58 y.o. female presents for concern of right second digit. Relates a couple months ago she was wearing a different pair of shoes and ended up getting a lot of pain in the foot. States since then the pain has resolved but now her toe is sitting higher up and more prominent. Deneis any pain . Denies any other pedal complaints. Denies n/v/f/c.   No past medical history on file.  Objective:  Physical Exam: Vascular: DP/PT pulses 2/4 bilateral. CFT <3 seconds. Normal hair growth on digits. No edema.  Skin. No lacerations or abrasions bilateral feet.  Musculoskeletal: MMT 5/5 bilateral lower extremities in DF, PF, Inversion and Eversion. Deceased ROM in DF of ankle joint. Hammered second digit with rigid deformity noted. No pain to palpation not pain with Rom of the digit. No pain to plantar second MPJ.  Neurological: Sensation intact to light touch.   Assessment:   1. Hammertoe of second toe of right foot      Plan:  Patient was evaluated and treated and all questions answered. -X-rays reviewed. Mild degenerative changes noted to first MPJ. Hammered digits 2-5 mostly the second.  -Educated on hammertoes and treatment options  -Discussed padding including toe caps and crest pads.  -Discussed need for potential surgery if pain does not improved.  -Patient to follow-up as needed. Discussed calling if any changes or increased pain.    Jennefer Moats, DPM

## 2024-02-10 ENCOUNTER — Emergency Department (HOSPITAL_BASED_OUTPATIENT_CLINIC_OR_DEPARTMENT_OTHER)
Admission: EM | Admit: 2024-02-10 | Discharge: 2024-02-10 | Disposition: A | Discharge status: Home / Self Care | Attending: Emergency Medicine | Admitting: Emergency Medicine

## 2024-02-10 ENCOUNTER — Other Ambulatory Visit: Payer: Self-pay

## 2024-02-10 ENCOUNTER — Encounter (HOSPITAL_BASED_OUTPATIENT_CLINIC_OR_DEPARTMENT_OTHER): Payer: Self-pay | Admitting: Emergency Medicine

## 2024-02-10 DIAGNOSIS — Z79899 Other long term (current) drug therapy: Secondary | ICD-10-CM | POA: Insufficient documentation

## 2024-02-10 DIAGNOSIS — I10 Essential (primary) hypertension: Secondary | ICD-10-CM | POA: Diagnosis not present

## 2024-02-10 DIAGNOSIS — T783XXA Angioneurotic edema, initial encounter: Secondary | ICD-10-CM

## 2024-02-10 DIAGNOSIS — K148 Other diseases of tongue: Secondary | ICD-10-CM | POA: Diagnosis present

## 2024-02-10 MED ORDER — AMLODIPINE BESYLATE 5 MG PO TABS: 5.0000 mg | ORAL_TABLET | Freq: Every day | ORAL | 2 refills | Status: AC

## 2024-02-10 MED ORDER — DIPHENHYDRAMINE HCL 50 MG/ML IJ SOLN
25.0000 mg | Freq: Once | INTRAMUSCULAR | Status: AC
  Administered 2024-02-10: 25 mg via INTRAVENOUS
  Filled 2024-02-10: qty 1

## 2024-02-10 MED ORDER — FAMOTIDINE IN NACL 20-0.9 MG/50ML-% IV SOLN
20.0000 mg | Freq: Once | INTRAVENOUS | Status: AC
  Administered 2024-02-10: 20 mg via INTRAVENOUS
  Filled 2024-02-10: qty 50

## 2024-02-10 MED ORDER — METHYLPREDNISOLONE SODIUM SUCC 125 MG IJ SOLR
125.0000 mg | Freq: Once | INTRAMUSCULAR | Status: AC
  Administered 2024-02-10: 125 mg via INTRAVENOUS
  Filled 2024-02-10: qty 2

## 2024-02-10 NOTE — ED Notes (Addendum)
 Pt states that she feels good and feels like her tongue swelling is better.

## 2024-02-10 NOTE — Discharge Instructions (Signed)
?? ???? ?? ??? ??????? ???? ?????? ???????? (???? ??????).  ?? ??????? ????? ????? ????? ?????????? ???? ????? ????????? ???? ??? ????.  ???? ?? ????? ?? ?????? ?? ???? ??? ?????? ??? ??????? ???   MyChart.  ???? ?? ????? ??????? ???? ????? ??? ????? ???? ????? ??????. ???? ?? ????? ????????.  ??? ????? ??? ??? ??????? ??? ???? ??? ??? ???: ????? ??????? ????? ?? ??????? ?? ?? ????? ???? ?????.  ????? ??????? ??? ???????. ????? ???????? ?????.  ---  You were seen for your angioedema (tongue swelling) in the emergency department.   At home, please STOP the lisinopril and START the amlodipine for your blood pressure.    Check your MyChart online for the results of any tests that had not resulted by the time you left the emergency department.   Follow-up with your primary doctor in 2-3 days regarding your visit.  Follow-up with the allergy clinic.   Return immediately to the emergency department if you experience any of the following: worsening swelling, difficulty breathing, or any other concerning symptoms.    Thank you for visiting our Emergency Department. It was a pleasure taking care of you today.

## 2024-02-10 NOTE — ED Provider Notes (Signed)
  Physical Exam  BP (!) 158/82 (BP Location: Left Arm)   Pulse (!) 117   Temp 98.5 F (36.9 C) (Oral)   Resp 18   Wt 83.5 kg   SpO2 99%   BMI 35.94 kg/m   Physical Exam  Procedures  Procedures  ED Course / MDM   Clinical Course as of 02/10/24 2335  Tue Feb 10, 2024  1842 On repeat evaluation, patient's tongue swelling is significantly improving. [CF]  1900 Assumed primary care of the patient from [facility].  [RP]  1945 Upon repeat evaluation patient's tongue swelling has improved.  Her vital signs have normalized.  No stridor.  No swelling of her oropharynx.  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  Have added this to her allergy list.  Will discontinue her lisinopril and start her on amlodipine.  Will have her follow-up with an an allergist and her primary doctor for additional evaluation [RP]    Clinical Course User Index [CF] Theotis Cameron HERO, PA-C [RP] Yolande Lamar BROCKS, MD   Medical Decision Making Risk Prescription drug management.      Yolande Lamar BROCKS, MD 02/10/24 [id number]

## 2024-02-10 NOTE — ED Provider Notes (Signed)
 [facility] Provider Note   CSN: [id number] Arrival date & time: 02/10/24  1629     Patient presents with: Angioedema   Anne Fisher is a 58 y.o. female patient who presents to the emergency department today for further evaluation of tongue swelling.  This occurred at 1:30 PM.  Patient does take lisinopril and has been taking this since 2014.  Patient also states that she had some lemon with some salt which she thinks could be attributing to the swelling.  Patient denies any shortness of breath or trouble swallowing.  She states that symptoms are currently improving.  Son is at bedside provides some of the history as well.  He states that the mom's tongue is certainly more swollen than baseline.  Has never had this happen before.  Denies any food allergies or any other allergies that they are aware of.   HPI     Prior to Admission medications   Medication Sig Start Date End Date Taking? Authorizing Provider  amLODipine (NORVASC) 5 MG tablet Take 1 tablet (5 mg total) by mouth daily. 02/10/24  Yes Yolande Lamar BROCKS, MD  benzonatate  (TESSALON ) 100 MG capsule Take 1-2 capsules (100-200 mg total) by mouth 3 (three) times daily as needed for cough. 08/23/15   Juliane Che, PA  diclofenac  (VOLTAREN ) 75 MG EC tablet Take 1 tablet (75 mg total) by mouth 2 (two) times daily. 03/01/20   Kip Ade, NP  ipratropium (ATROVENT ) 0.03 % nasal spray Place 2 sprays into both nostrils 2 (two) times daily. 08/23/15   Juliane Che, PA  metFORMIN  (GLUCOPHAGE ) 500 MG tablet Take 1 tablet (500 mg total) by mouth 2 (two) times daily with a meal. 03/22/20   Kip Ade, NP    Allergies: Lisinopril    Review of Systems  All other systems reviewed and are negative.   Updated Vital Signs BP (!) 158/82 (BP Location: Left Arm)   Pulse (!) 117   Temp 98.5 F (36.9 C) (Oral)   Resp 18   Wt 83.5 kg   SpO2 99%   BMI 35.94 kg/m   Physical  Exam Vitals and nursing note reviewed.  Constitutional:      General: She is not in acute distress.    Appearance: Normal appearance.  HENT:     Head: Normocephalic and atraumatic.     Mouth/Throat:     Comments: Tongue swelling.  Mild swelling of the oral floor. Eyes:     General:        Right eye: No discharge.        Left eye: No discharge.  Cardiovascular:     Comments: Regular rate and rhythm.  S1/S2 are distinct without any evidence of murmur, rubs, or gallops.  Radial pulses are 2+ bilaterally.  Dorsalis pedis pulses are 2+ bilaterally.  No evidence of pedal edema. Pulmonary:     Comments: Clear to auscultation bilaterally.  Normal effort.  No respiratory distress.  No evidence of wheezes, rales, or rhonchi heard throughout. Abdominal:     General: Abdomen is flat. Bowel sounds are normal. There is no distension.     Tenderness: There is no abdominal tenderness. There is no guarding or rebound.  Musculoskeletal:        General: Normal range of motion.     Cervical back: Neck supple.  Skin:    General: Skin is warm and dry.     Findings: No rash.  Neurological:     General:  No focal deficit present.     Mental Status: She is alert.  Psychiatric:        Mood and Affect: Mood normal.        Behavior: Behavior normal.     (all labs ordered are listed, but only abnormal results are displayed) Labs Reviewed - No data to display  EKG: None  Radiology: No results found.   Procedures   Medications Ordered in the ED  methylPREDNISolone sodium succinate (SOLU-MEDROL) 125 mg/2 mL injection 125 mg (125 mg Intravenous Given 02/10/24 1652)  diphenhydrAMINE (BENADRYL) injection 25 mg (25 mg Intravenous Given 02/10/24 1653)  famotidine (PEPCID) IVPB 20 mg premix (0 mg Intravenous Stopped 02/10/24 1731)    Clinical Course as of 02/10/24 1859  Tue Feb 10, 2024  1842 On repeat evaluation, patient's tongue swelling is significantly improving. [CF]    Clinical Course User  Index [CF] Theotis Cameron HERO, PA-C    Medical Decision Making Anne Fisher is a 58 y.o. female patient who presents to the emergency department today for further evaluation of tongue swelling.  Airway is intact at this time.  She is in no respiratory distress.  Tongue is certainly swollen.  Will treat with Solu-Medrol, Pepcid, Benadryl.  Patient is currently improving.  Will lobs for the next 40 or so minutes then plan to discharge. Due to shift change, the rest of the patient's care will be transferred to my attending Dr. Yolande with ultimate disposition will be made. Patient will need to stop her Lisinopril and follow up with her PCP more than likely.   Risk Prescription drug management.     Final diagnoses:  None    ED Discharge Orders          Ordered    amLODipine (NORVASC) 5 MG tablet  Daily        02/10/24 1843               Theotis Cameron HERO, PA-C 02/10/24 1859    Yolande Lamar BROCKS, MD 02/10/24 [phone]

## 2024-02-10 NOTE — ED Triage Notes (Signed)
 Pt with son- son to interpret- pt with new angioedema since appx 1300 after eating tuna with lemon.  NKA. Respirations equal and unlabored.   RT to triage to assess. Pt reports only taking metformin  today, has not taken lisinopril.
